# Patient Record
Sex: Male | Born: 1990 | Race: Black or African American | Hispanic: No | Marital: Single | Smoking: Never smoker
Health system: Southern US, Community
[De-identification: ages and names within clinical notes are randomized; demographics above are authoritative.]

---
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
## 2017-01-01 ENCOUNTER — Ambulatory Visit (HOSPITAL_COMMUNITY)
Admission: EM | Admit: 2017-01-01 | Discharge: 2017-01-01 | Disposition: A | Discharge status: Home / Self Care | Payer: Self-pay | Attending: Urgent Care | Admitting: Urgent Care

## 2017-01-01 ENCOUNTER — Ambulatory Visit (INDEPENDENT_AMBULATORY_CARE_PROVIDER_SITE_OTHER): Payer: Self-pay

## 2017-01-01 ENCOUNTER — Encounter (HOSPITAL_COMMUNITY): Payer: Self-pay | Admitting: *Deleted

## 2017-01-01 DIAGNOSIS — L03115 Cellulitis of right lower limb: Secondary | ICD-10-CM

## 2017-01-01 DIAGNOSIS — M79661 Pain in right lower leg: Secondary | ICD-10-CM

## 2017-01-01 MED ORDER — DOXYCYCLINE HYCLATE 100 MG PO CAPS
100.0000 mg | ORAL_CAPSULE | Freq: Two times a day (BID) | ORAL | 0 refills | Status: DC
Start: 2017-01-01 — End: 2018-04-11

## 2017-01-01 NOTE — ED Provider Notes (Signed)
  MRN: [id number] DOB: [birth year]  Subjective:   Seth Franklin is a 26 y.o. male presenting for chief complaint of Abscess  Reports ~10 year history of intermittent recurrent infections of his right lower leg. He also gets infections of his lower abdomen and groin area. He usually just washes his infections and applies dressings to have them resolve on their own. This particular episode started 2 days ago over his right lower leg. Has had drainage, redness, mild pain with walking. Denies fever, numbness or tingling. Denies smoking cigarettes. Patient binge drinks over the weekends.  Seth Franklin is not currently taking any medications and has No Known Allergies.  Seth Franklin denies past medical and surgical history.   Objective:   Vitals: BP 134/82 (BP Location: Right Arm)   Pulse 82   Temp 99.5 F (37.5 C) (Oral)   Resp 18   SpO2 100%   Physical Exam  Constitutional: He is oriented to person, place, and time. He appears well-developed and well-nourished.  Cardiovascular: Normal rate.   Pulmonary/Chest: Effort normal.  Musculoskeletal:       Right lower leg: He exhibits tenderness and swelling (with associated erythema). He exhibits no bony tenderness, no edema, no deformity and no laceration.       Legs: Neurological: He is alert and oriented to person, place, and time.  Skin: Skin is warm and dry.  Psychiatric: He has a normal mood and affect.   Dg Tibia/fibula Right  Result Date: 01/01/2017 CLINICAL DATA:  Medial calf abscess. EXAM: RIGHT TIBIA AND FIBULA - 2 VIEW COMPARISON:  None. FINDINGS: No evidence of bony destruction, soft tissue foreign body or soft tissue gas. The knee and ankle appear normal without evidence of arthropathy. No bony lesions identified. IMPRESSION: Negative. Electronically Signed   By: Irish LackGlenn  Yamagata M.D.   On: 01/01/2017 17:23   Assessment and Plan :   Cellulitis of right lower extremity  Pain in right lower leg  Will start doxycycline. Patient is  to f/u for wound check in 2 days. Recommended patient set up PCP for work up of his recurrent skin infections. Return-to-clinic precautions discussed, patient verbalized understanding.   Wallis BambergMario Delbra Zellars, PA-C [facility] [phone] 01/01/2017  4:37 PM   Wallis BambergMani, Nikcole Eischeid, PA-C 01/01/17 1735

## 2017-01-01 NOTE — ED Triage Notes (Signed)
Pt  Has    A  Yellow  Lesion  To  r   Lower     Leg    With  Redness       And   Swelling  /       Pain    Symptoms   For   sev  Days        Pt  Has   Had    Similar   Lesions  Like  This  Before  Pt also  Reports  As   Well that  He  Has  A  r   Earache

## 2017-11-01 ENCOUNTER — Ambulatory Visit (HOSPITAL_COMMUNITY)
Admission: EM | Admit: 2017-11-01 | Discharge: 2017-11-01 | Disposition: A | Discharge status: Home / Self Care | Payer: Self-pay | Attending: Family Medicine | Admitting: Family Medicine

## 2017-11-01 ENCOUNTER — Encounter (HOSPITAL_COMMUNITY): Payer: Self-pay | Admitting: Emergency Medicine

## 2017-11-01 DIAGNOSIS — H1033 Unspecified acute conjunctivitis, bilateral: Secondary | ICD-10-CM

## 2017-11-01 NOTE — ED Triage Notes (Signed)
Pt c/o bilateral eye redness and drainage.

## 2017-11-01 NOTE — Discharge Instructions (Addendum)
You may try over the counter Naphcon eye drops.

## 2017-11-01 NOTE — ED Provider Notes (Signed)
  [facility]   [id number] 11/01/17 Arrival Time: 1315  ASSESSMENT & PLAN:  1. Acute conjunctivitis of both eyes, unspecified acute conjunctivitis type    Discussed the diagnosis and proper care of conjunctivitis.  Stressed household Presenter, broadcastinghygiene. School/daycare note written. Local eye care discussed.  Reviewed expectations re: course of current medical issues. Questions answered. Outlined signs and symptoms indicating need for more acute intervention. Patient verbalized understanding. After Visit Summary given.   SUBJECTIVE:  Seth Franklin is a 27 y.o. male who presents with complaint of persistent irritation of his eyes.  Onset gradual, approximately 1 day ago. Injury: no. Visual changes: no. Contact lens use: no. Self treatment: none. Describes 'gritty' feeling of eyes. No recent illness. Works at a Corporate treasurerbarber shop.  ROS: As per HPI.  OBJECTIVE:  Vitals:   11/01/17 1402  BP: (!) 153/88  Pulse: (!) 106  Resp: 18  Temp: 98.6 F (37 C)  SpO2: 100%    General appearance: alert; no distress Eyes: conjunctiva: 2+ injection bilaterally Neck: supple Skin: warm and dry Psychological: alert and cooperative; normal mood and affect  No Known Allergies  History reviewed. No pertinent past medical history. Social History   Socioeconomic History  . Marital status: Single    Spouse name: Not on file  . Number of children: Not on file  . Years of education: Not on file  . Highest education level: Not on file  Occupational History  . Not on file  Social Needs  . Financial resource strain: Not on file  . Food insecurity:    Worry: Not on file    Inability: Not on file  . Transportation needs:    Medical: Not on file    Non-medical: Not on file  Tobacco Use  . Smoking status: Never Smoker  . Smokeless tobacco: Former Engineer, waterUser  Substance and Sexual Activity  . Alcohol use: Yes  . Drug use: Not on file  . Sexual activity: Not on file  Lifestyle  . Physical  activity:    Days per week: Not on file    Minutes per session: Not on file  . Stress: Not on file  Relationships  . Social connections:    Talks on phone: Not on file    Gets together: Not on file    Attends religious service: Not on file    Active member of club or organization: Not on file    Attends meetings of clubs or organizations: Not on file    Relationship status: Not on file  . Intimate partner violence:    Fear of current or ex partner: Not on file    Emotionally abused: Not on file    Physically abused: Not on file    Forced sexual activity: Not on file  Other Topics Concern  . Not on file  Social History Narrative  . Not on file   No family history on file. History reviewed. No pertinent surgical history.   Mardella LaymanHagler, Dayjah Selman, MD 11/01/17 1435

## 2018-04-11 ENCOUNTER — Other Ambulatory Visit: Payer: Self-pay

## 2018-04-11 ENCOUNTER — Encounter (INDEPENDENT_AMBULATORY_CARE_PROVIDER_SITE_OTHER): Payer: Self-pay | Admitting: Physician Assistant

## 2018-04-11 ENCOUNTER — Ambulatory Visit (INDEPENDENT_AMBULATORY_CARE_PROVIDER_SITE_OTHER): Payer: Self-pay | Admitting: Physician Assistant

## 2018-04-11 VITALS — BP 154/92 | HR 94 | Temp 98.0°F | Ht 68.0 in | Wt 198.8 lb

## 2018-04-11 DIAGNOSIS — Z0000 Encounter for general adult medical examination without abnormal findings: Secondary | ICD-10-CM

## 2018-04-11 DIAGNOSIS — A630 Anogenital (venereal) warts: Secondary | ICD-10-CM

## 2018-04-11 DIAGNOSIS — Z23 Encounter for immunization: Secondary | ICD-10-CM

## 2018-04-11 NOTE — Progress Notes (Signed)
   Subjective:  Patient ID: Seth NordmannNicholas A Franklin, male    DOB: [birth year]  Age: 27 y.o. MRN: [id number]  CC:   HPI Seth Franklin is a 27 y.o. male with no significant medical history presents as a new patient for an annual physical. Generally feeling well except for a few genital lesions. Thinks he has warts. BP noted to be elevated today. Says he may become anxious when going to the doctor's office. Otherwise, does not feel anxious. Denies any other symptoms or complaints.        Outpatient Medications Prior to Visit  Medication Sig Dispense Refill  . doxycycline (VIBRAMYCIN) 100 MG capsule Take 1 capsule (100 mg total) by mouth 2 (two) times daily. (Patient not taking: Reported on 11/01/2017) 20 capsule 0   No facility-administered medications prior to visit.      ROS Review of Systems  Constitutional: Negative for chills, fever and malaise/fatigue.  Eyes: Negative for blurred vision.  Respiratory: Negative for shortness of breath.   Cardiovascular: Negative for chest pain and palpitations.  Gastrointestinal: Negative for abdominal pain and nausea.  Genitourinary: Negative for dysuria and hematuria.  Musculoskeletal: Negative for joint pain and myalgias.  Skin: Negative for rash.       Genital warts  Neurological: Negative for tingling and headaches.  Psychiatric/Behavioral: Negative for depression. The patient is not nervous/anxious.     Objective:  Ht 5\' 8"  (1.727 m)   Wt 198 lb 12.8 oz (90.2 kg)   BMI 30.23 kg/m   Vitals:   04/11/18 1341  BP: (!) 154/92  Pulse: 94  Temp: 98 F (36.7 C)  TempSrc: Oral  SpO2: 99%  Weight: 198 lb 12.8 oz (90.2 kg)  Height: 5\' 8"  (1.727 m)      Physical Exam Vitals signs reviewed.  Constitutional:      Comments: Well developed, well nourished, NAD, polite  HENT:     Head: Normocephalic and atraumatic.  Eyes:     General: No scleral icterus. Neck:     Musculoskeletal: Normal range of motion and neck supple.   Thyroid: No thyromegaly.  Cardiovascular:     Rate and Rhythm: Normal rate and regular rhythm.     Heart sounds: Normal heart sounds.  Pulmonary:     Effort: Pulmonary effort is normal.     Breath sounds: Normal breath sounds.  Abdominal:     General: Bowel sounds are normal.     Palpations: Abdomen is soft.     Tenderness: There is no abdominal tenderness.  Genitourinary:    Comments: Small papules with cauliflower texture on penis Skin:    General: Skin is warm and dry.     Coloration: Skin is not pale.     Findings: No erythema or rash.  Neurological:     Mental Status: He is alert and oriented to person, place, and time.  Psychiatric:        Behavior: Behavior normal.        Thought Content: Thought content normal.      Assessment & Plan:    1. Annual physical exam - CBC with Differential - Comprehensive metabolic panel - TSH  2. Genital warts - Ambulatory referral to Dermatology     Follow-up: PRN  Loletta Specteroger David  PA

## 2018-04-11 NOTE — Patient Instructions (Signed)
Genital Warts  Genital warts are small growths in the area around the genitals or the anus. They are caused by a type of germ (HPV virus). This germ is spread from person to person during sex. It can be spread through vaginal, anal, and oral sex. Genital warts can lead to other problems if they are not treated.  A person is more likely to have this condition if he or she:   Has sex without using a condom.   Has sex with many people.   Has sex before the age of 16.   Has a weak body defense (immune) system.  This condition can be treated with medicines. Your doctor may also burn or freeze the warts. In some cases, surgery may be done to remove the warts.  Follow these instructions at home:  Medicines     Apply over-the-counter and prescription medicines only as told by your doctor.   Do not use medicines that are meant for treating hand warts.   Talk with your doctor about using creams to treat itching.  Instructions for women   Plan to have regular tests to check for cervical cancer. Your risk for this cancer increases when you have genital warts.   If you become pregnant, tell your doctor that you have had genital warts. The germ can be passed to the baby.  General instructions   Do not touch or scratch the warts.   Do not have sex until your treatment is done.   Tell your current and past sexual partners about your condition. They may need treatment.   After treatment, use condoms during sex.   Keep all follow-up visits as told by your doctor. This is important.  How is this prevented?  Talk with your doctor about getting the HPV shot. The HPV shot:   Can help stop some HPV infections and cancers.   Is given to males and females who are 11-26 years old.   Will not work if you already have HPV.   Is not recommended for pregnant women.  Contact a doctor if:   You have redness, swelling, or pain in the area of the treated skin.   You have a fever.   You feel sick.   You feel lumps in the area  around your genitals or anus.   You have bleeding in the area around your genitals or anus.   You have pain during sex.  Summary   Genital warts are small growths in the areas around the genitals or the anus. They are caused by a type of germ (HPV virus).   The germ is spread by having vaginal, anal, or oral sex without using a condom.   This condition is treated using medicines. In some cases, freezing, burning, or surgery may be done to get rid of the warts.   This condition may be prevented by getting a HPV shot.  This information is not intended to replace advice given to you by your health care provider. Make sure you discuss any questions you have with your health care provider.  Document Released: 07/05/2009 Document Revised: 05/15/2017 Document Reviewed: 05/15/2017  Elsevier Interactive Patient Education  2019 Elsevier Inc.

## 2018-04-12 LAB — COMPREHENSIVE METABOLIC PANEL
ALT: 27 IU/L (ref 0–44)
AST: 22 IU/L (ref 0–40)
Albumin/Globulin Ratio: 2 (ref 1.2–2.2)
Albumin: 5.2 g/dL (ref 3.5–5.5)
Alkaline Phosphatase: 72 IU/L (ref 39–117)
BUN/Creatinine Ratio: 9 (ref 9–20)
BUN: 8 mg/dL (ref 6–20)
Bilirubin Total: 0.6 mg/dL (ref 0.0–1.2)
CO2: 24 mmol/L (ref 20–29)
Calcium: 9.8 mg/dL (ref 8.7–10.2)
Chloride: 100 mmol/L (ref 96–106)
Creatinine, Ser: 0.92 mg/dL (ref 0.76–1.27)
GFR calc Af Amer: 133 mL/min/{1.73_m2} (ref >59)
GFR calc non Af Amer: 115 mL/min/{1.73_m2} (ref >59)
Globulin, Total: 2.6 g/dL (ref 1.5–4.5)
Glucose: 106 mg/dL — ABNORMAL HIGH (ref 65–99)
Potassium: 4 mmol/L (ref 3.5–5.2)
SODIUM: 140 mmol/L (ref 134–144)
Total Protein: 7.8 g/dL (ref 6.0–8.5)

## 2018-04-12 LAB — CBC WITH DIFFERENTIAL/PLATELET
Basophils Absolute: 0.1 10*3/uL (ref 0.0–0.2)
Basos: 1 %
EOS (ABSOLUTE): 0.1 10*3/uL (ref 0.0–0.4)
Eos: 2 %
Hematocrit: 43.6 % (ref 37.5–51.0)
Hemoglobin: 14.8 g/dL (ref 13.0–17.7)
Immature Grans (Abs): 0 10*3/uL (ref 0.0–0.1)
Immature Granulocytes: 1 %
Lymphocytes Absolute: 2.9 10*3/uL (ref 0.7–3.1)
Lymphs: 44 %
MCH: 31 pg (ref 26.6–33.0)
MCHC: 33.9 g/dL (ref 31.5–35.7)
MCV: 91 fL (ref 79–97)
MONOS ABS: 0.6 10*3/uL (ref 0.1–0.9)
Monocytes: 10 %
Neutrophils Absolute: 2.6 10*3/uL (ref 1.4–7.0)
Neutrophils: 42 %
Platelets: 315 10*3/uL (ref 150–450)
RBC: 4.78 x10E6/uL (ref 4.14–5.80)
RDW: 11 % — ABNORMAL LOW (ref 12.3–15.4)
WBC: 6.4 10*3/uL (ref 3.4–10.8)

## 2018-04-12 LAB — TSH: TSH: 3.19 u[IU]/mL (ref 0.450–4.500)

## 2018-04-15 ENCOUNTER — Telehealth (INDEPENDENT_AMBULATORY_CARE_PROVIDER_SITE_OTHER): Payer: Self-pay

## 2018-04-15 NOTE — Telephone Encounter (Signed)
-----   Message from Loletta Specteroger David Gomez, PA-C sent at 04/12/2018  1:47 PM EST ----- Slight elevation in glucose, should have A1c done at next visit. Rest of labs normal.

## 2018-04-15 NOTE — Telephone Encounter (Signed)
Patient is aware of slight elevation in glucose. Advised patient to have A1c at next office visit. All other labs normal. Maryjean Mornempestt S Mathews Stuhr, CMA

## 2018-08-04 IMAGING — DX DG TIBIA/FIBULA 2V*R*
4 series · 4 of 4 positions shown · non-contrast
Comparison: None.

CLINICAL DATA: Medial calf abscess.

EXAM:
RIGHT TIBIA AND FIBULA - 2 VIEW

[tibia ap (1 of 2)]
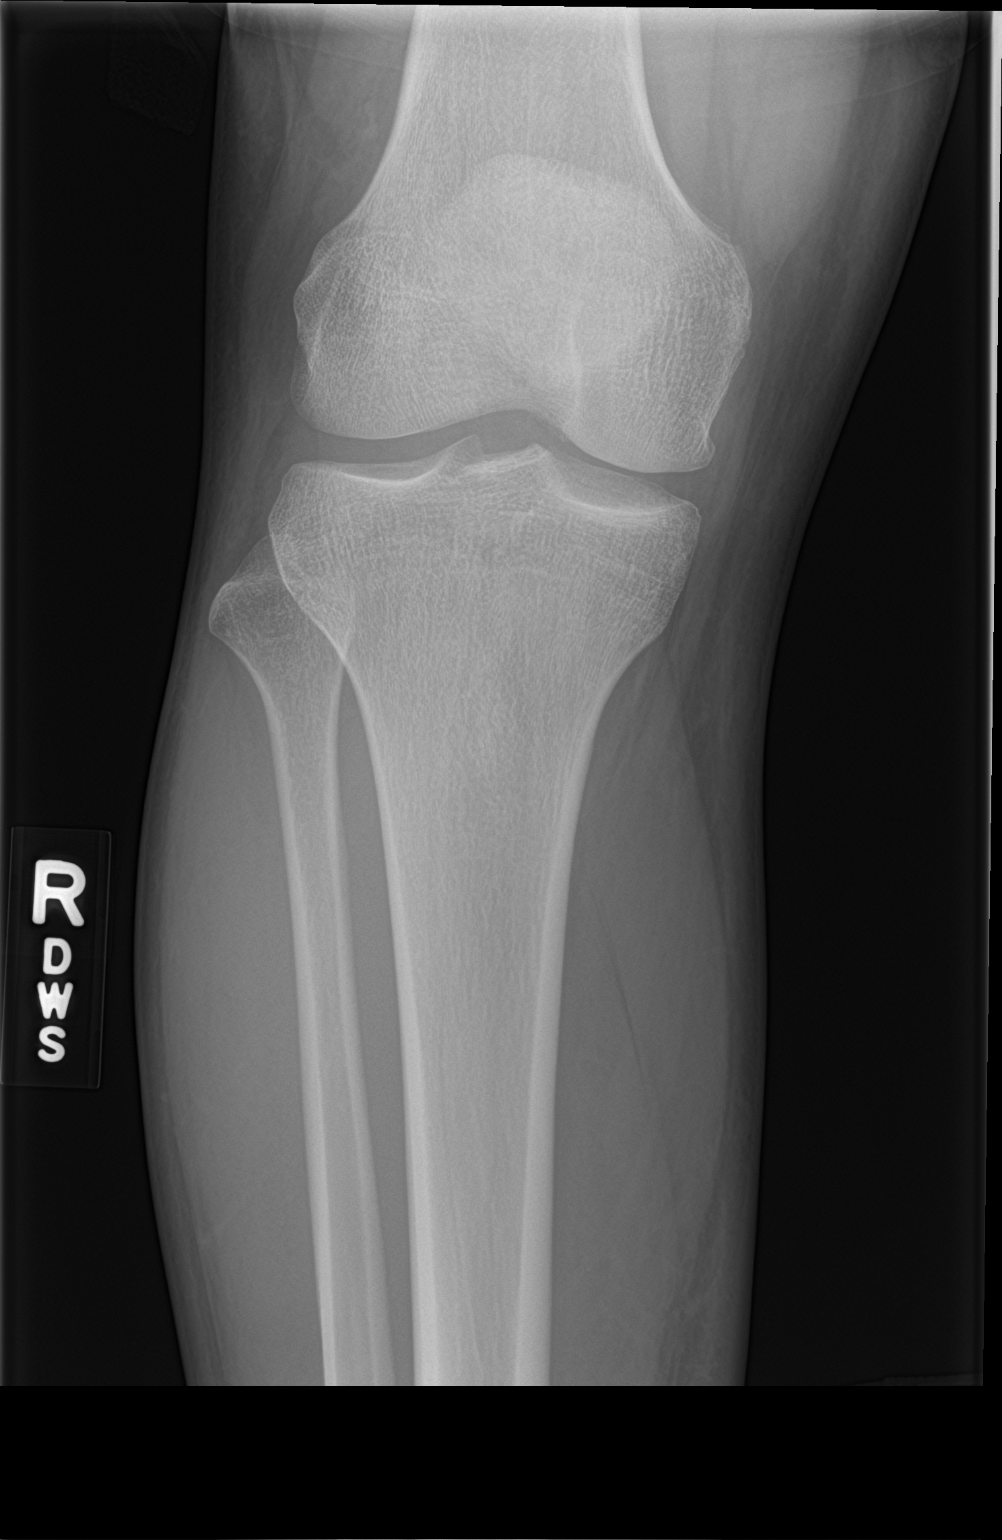

[tibia ap (2 of 2)]
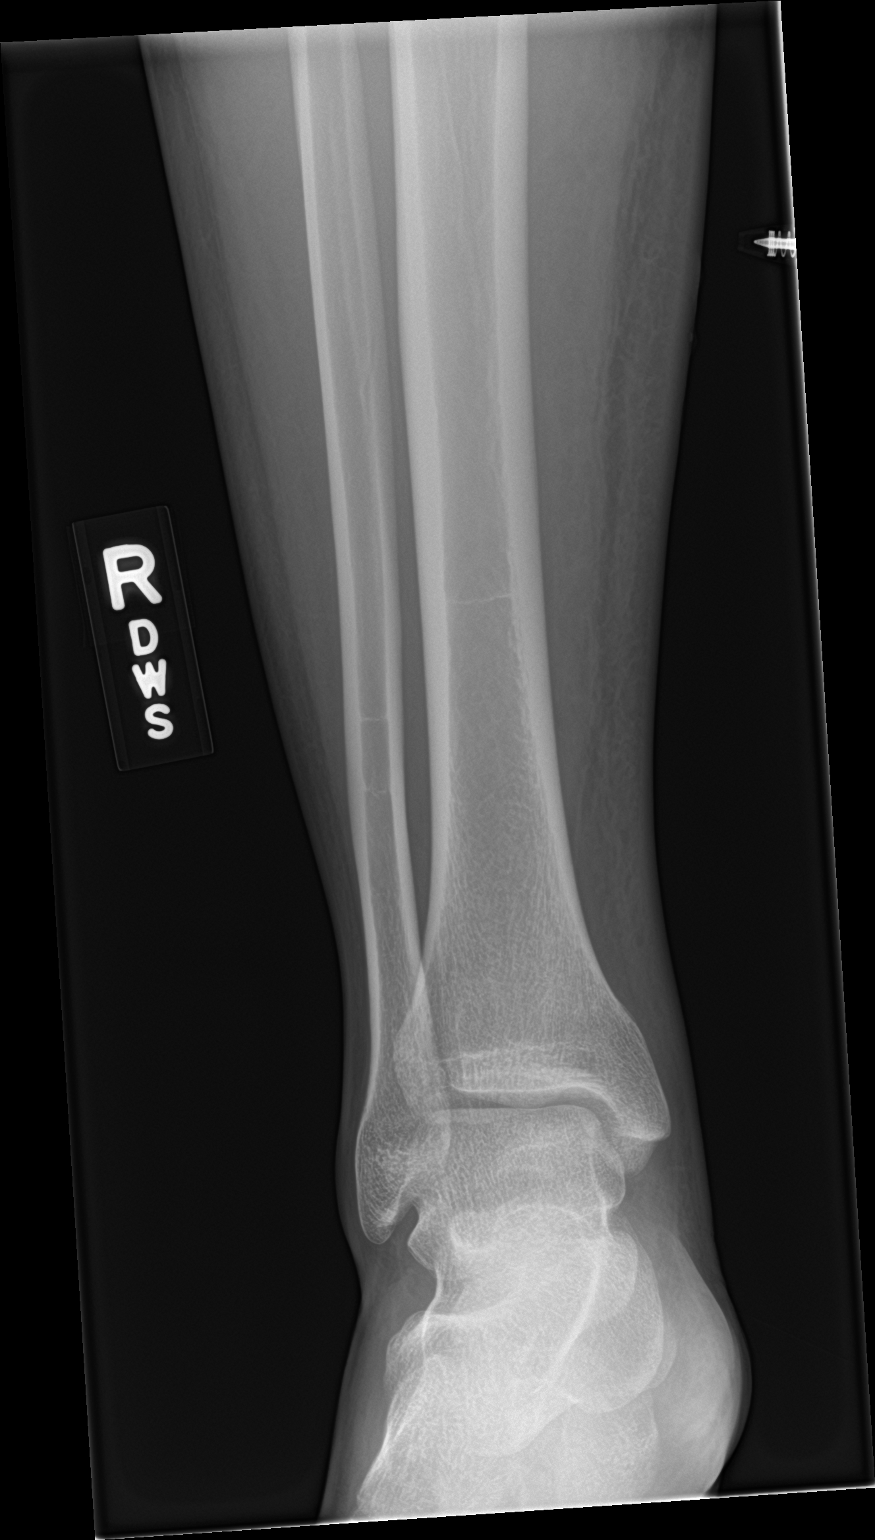

[tibia lat (1 of 2)]
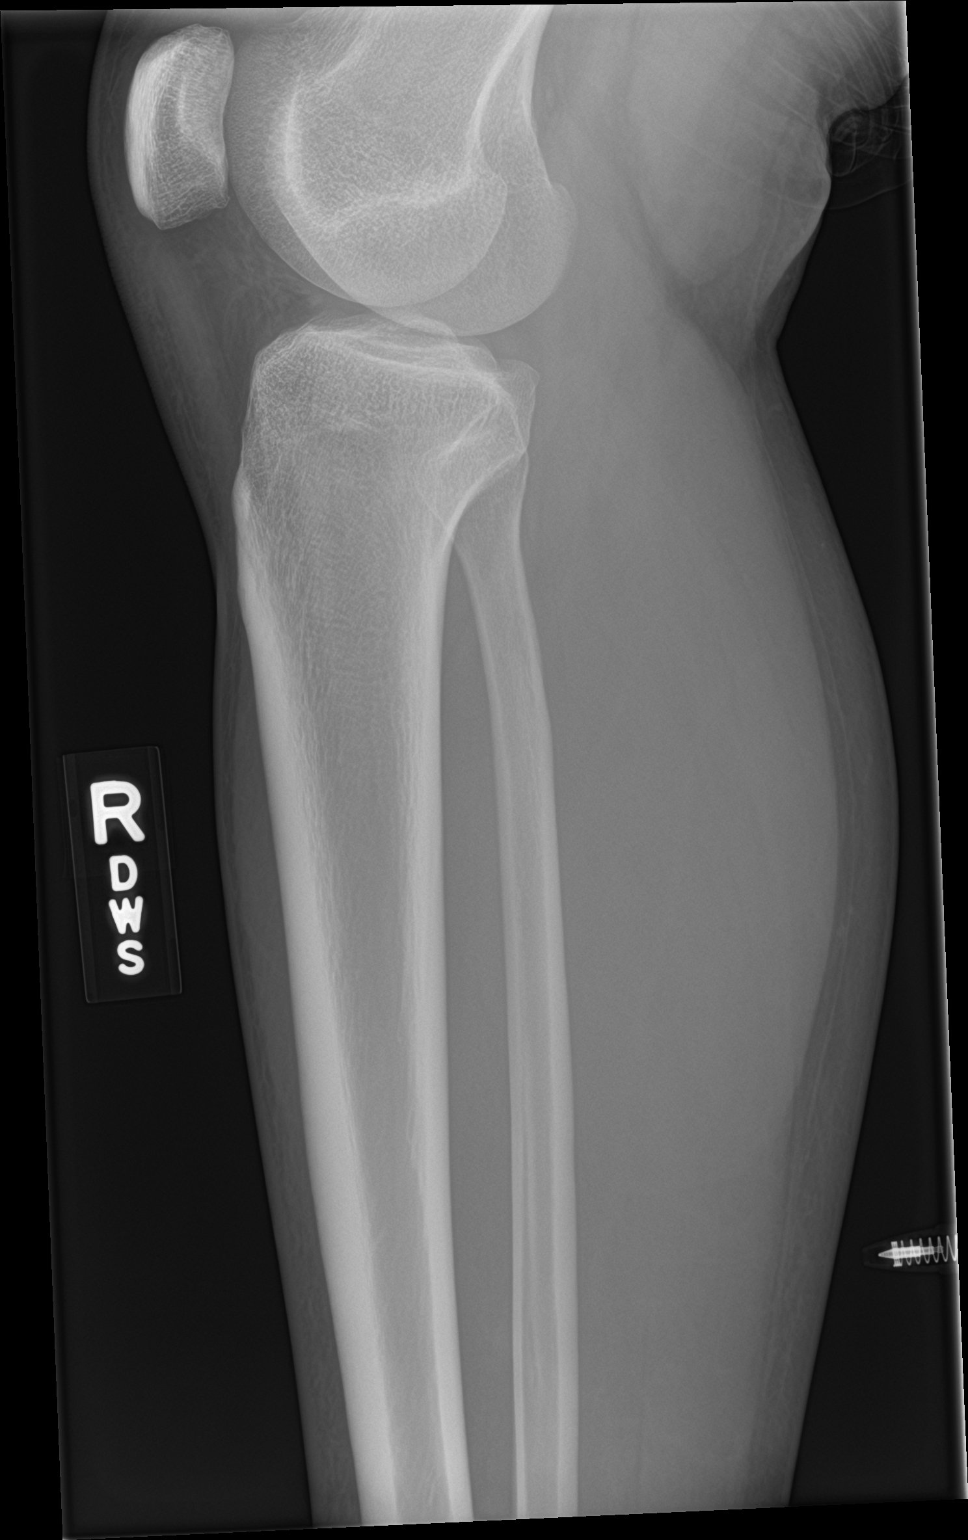

[tibia lat (2 of 2)]
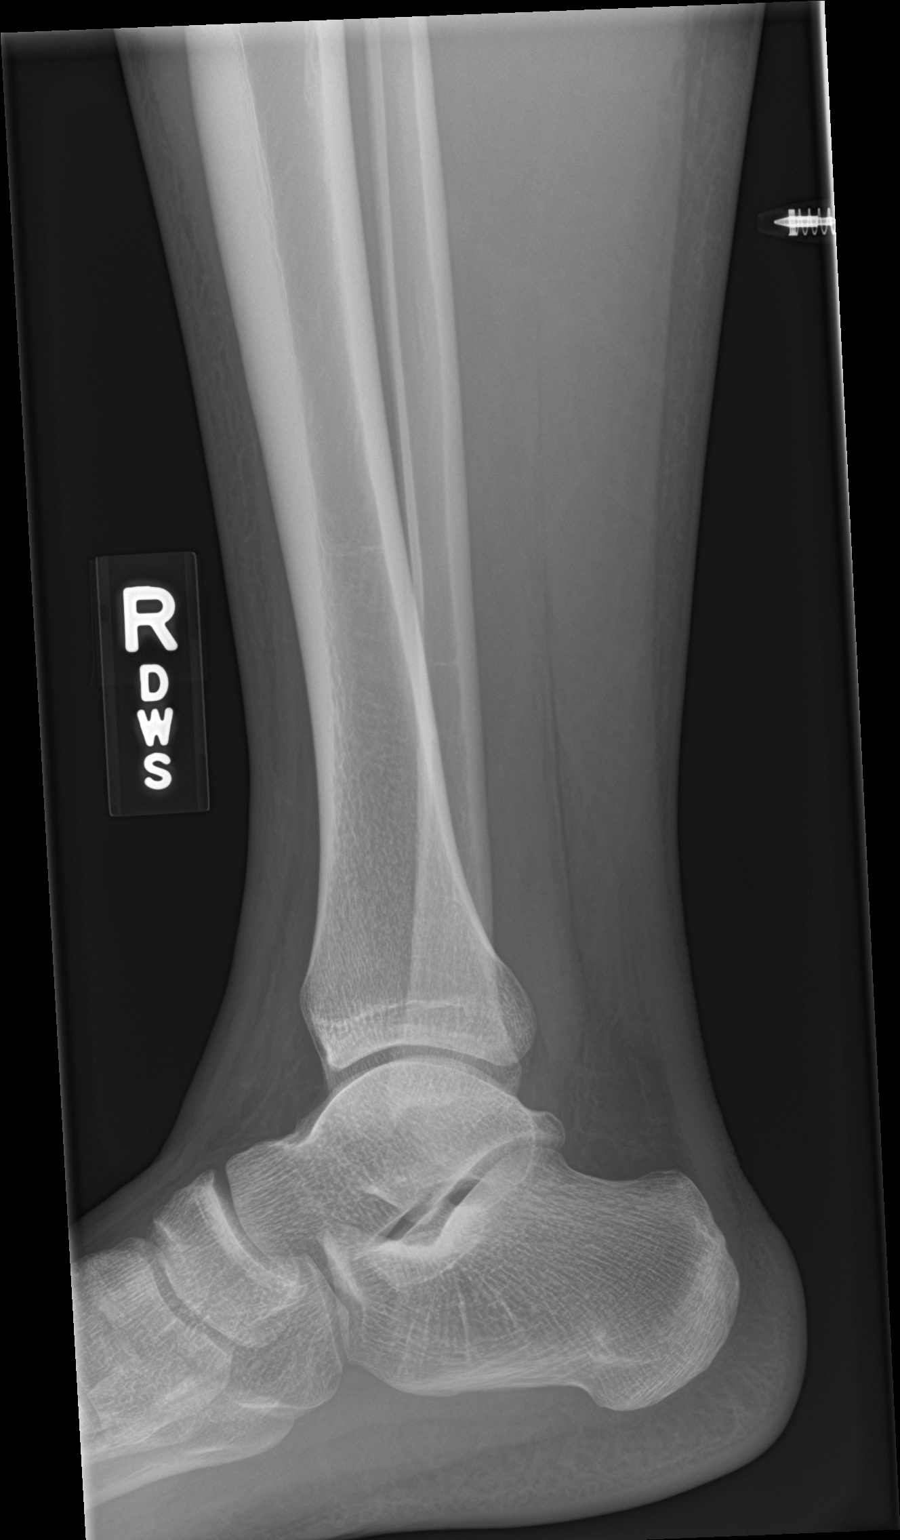

[4 of 4 positions shown; findings below may reference images not displayed]

FINDINGS: No evidence of bony destruction, soft tissue foreign body or soft
tissue gas. The knee and ankle appear normal without evidence of
arthropathy. No bony lesions identified.
IMPRESSION: Negative.

## 2020-03-25 ENCOUNTER — Ambulatory Visit (INDEPENDENT_AMBULATORY_CARE_PROVIDER_SITE_OTHER): Payer: Self-pay | Admitting: Primary Care

## 2020-03-25 ENCOUNTER — Encounter (INDEPENDENT_AMBULATORY_CARE_PROVIDER_SITE_OTHER): Payer: Self-pay | Admitting: Primary Care

## 2020-03-25 ENCOUNTER — Other Ambulatory Visit: Payer: Self-pay

## 2020-03-25 VITALS — BP 153/100 | HR 96 | Temp 97.5°F | Ht 68.0 in | Wt 216.4 lb

## 2020-03-25 DIAGNOSIS — Z202 Contact with and (suspected) exposure to infections with a predominantly sexual mode of transmission: Secondary | ICD-10-CM

## 2020-03-25 DIAGNOSIS — Z7689 Persons encountering health services in other specified circumstances: Secondary | ICD-10-CM

## 2020-03-25 DIAGNOSIS — I10 Essential (primary) hypertension: Secondary | ICD-10-CM

## 2020-03-25 MED ORDER — HYDROCHLOROTHIAZIDE 25 MG PO TABS: 25.0000 mg | ORAL_TABLET | Freq: Every day | ORAL | 3 refills | Status: AC

## 2020-03-25 NOTE — Progress Notes (Signed)
New Patient Office Visit  Subjective:  Patient ID: Seth Franklin, male    DOB: September 27, 1990  Age: 29 y.o. MRN: [id number]  CC:  Chief Complaint  Patient presents with  . New Patient (Initial Visit)    STD testing     HPI Seth Franklin presents for establish of care . Elevated Bp hx reviewed previously document. Denies shortness of breath, headaches, chest pain or lower extremity edema. Also, requesting STD testing previously relationship told him she had a STD but did not let him know what. He is also concern with insomnia.   History reviewed. No pertinent past medical history.  History reviewed. No pertinent surgical history.  History reviewed. No pertinent family history.  Social History   Socioeconomic History  . Marital status: Single    Spouse name: Not on file  . Number of children: Not on file  . Years of education: Not on file  . Highest education level: Not on file  Occupational History  . Not on file  Tobacco Use  . Smoking status: Never Smoker  . Smokeless tobacco: Former Engineer, water and Sexual Activity  . Alcohol use: Yes  . Drug use: Never  . Sexual activity: Yes    Partners: Female    Birth control/protection: Condom  Other Topics Concern  . Not on file  Social History Narrative  . Not on file   Social Determinants of Health   Financial Resource Strain:   . Difficulty of Paying Living Expenses: Not on file  Food Insecurity:   . Worried About Programme researcher, broadcasting/film/video in the Last Year: Not on file  . Ran Out of Food in the Last Year: Not on file  Transportation Needs:   . Lack of Transportation (Medical): Not on file  . Lack of Transportation (Non-Medical): Not on file  Physical Activity:   . Days of Exercise per Week: Not on file  . Minutes of Exercise per Session: Not on file  Stress:   . Feeling of Stress : Not on file  Social Connections:   . Frequency of Communication with Friends and Family: Not on file  . Frequency of  Social Gatherings with Friends and Family: Not on file  . Attends Religious Services: Not on file  . Active Member of Clubs or Organizations: Not on file  . Attends Banker Meetings: Not on file  . Marital Status: Not on file  Intimate Partner Violence:   . Fear of Current or Ex-Partner: Not on file  . Emotionally Abused: Not on file  . Physically Abused: Not on file  . Sexually Abused: Not on file    ROS Review of Systems  Constitutional: Positive for fatigue.  All other systems reviewed and are negative.   Objective:   Today's Vitals: BP (!) 153/100 (BP Location: Right Arm, Patient Position: Sitting, Cuff Size: Large)   Pulse 96   Temp (!) 97.5 F (36.4 C) (Temporal)   Ht 5\' 8"  (1.727 m)   Wt 216 lb 6.4 oz (98.2 kg)   SpO2 98%   BMI 32.90 kg/m   Physical Exam  General: Vital signs reviewed.  Patient is well-developed and well-nourished, male in no acute distress and cooperative with exam.  Head: Normocephalic and atraumatic. Eyes: EOMI, conjunctivae normal, no scleral icterus.  Neck: Supple, trachea midline, normal ROM, no JVD, masses, thyromegaly, or carotid bruit present.  Cardiovascular: RRR, S1 normal, S2 normal, no murmurs, gallops, or rubs. Pulmonary/Chest: Clear to auscultation  bilaterally, no wheezes, rales, or rhonchi. Abdominal: Soft, non-tender, non-distended, BS +, no masses, organomegaly, or guarding present.  Musculoskeletal: No joint deformities, erythema, or stiffness, ROM full and nontender. Extremities: No lower extremity edema bilaterally,  pulses symmetric and intact bilaterally. No cyanosis or clubbing. Neurological: A&O x3, Strength is normal and symmetric bilaterally, cranial nerve II-XII are grossly intact, no focal motor deficit, sensory intact to light touch bilaterally.  Skin: Warm, dry and intact. No rashes or erythema. Psychiatric: Normal mood and affect. speech and behavior is normal. Cognition and memory are normal.  Assessment  & Plan:  Seth Franklin was seen today for new patient (initial visit).  Diagnoses and all orders for this visit:  Encounter to establish care Establish care with new PCP   Essential hypertension Counseled on blood pressure goal of less than 130/80, low-sodium, DASH diet, medication compliance, 150 minutes of moderate intensity exercise per week. Discussed medication compliance, adverse effects. -     hydrochlorothiazide (HYDRODIURIL) 25 MG tablet; Take 1 tablet (25 mg total) by mouth daily.  Possible exposure to STD -     Cancel: Urine Culture -     Cytology (oral, anal, urethral) ancillary only -     HIV antibody (with reflex)    Outpatient Encounter Medications as of 03/25/2020  Medication Sig  . hydrochlorothiazide (HYDRODIURIL) 25 MG tablet Take 1 tablet (25 mg total) by mouth daily.   No facility-administered encounter medications on file as of 03/25/2020.    Follow-up: Return in about 6 weeks (around 05/06/2020) for BP ck.   Grayce Sessions, NP

## 2020-03-25 NOTE — Patient Instructions (Signed)
Preventing Hypertension Hypertension, commonly called high blood pressure, is when the force of blood pumping through the arteries is too strong. Arteries are blood vessels that carry blood from the heart throughout the body. Over time, hypertension can damage the arteries and decrease blood flow to important parts of the body, including the brain, heart, and kidneys. Often, hypertension does not cause symptoms until blood pressure is very high. For this reason, it is important to have your blood pressure checked on a regular basis. Hypertension can often be prevented with diet and lifestyle changes. If you already have hypertension, you can control it with diet and lifestyle changes, as well as medicine. What nutrition changes can be made? Maintain a healthy diet. This includes:  Eating less salt (sodium). Ask your health care provider how much sodium is safe for you to have. The general recommendation is to consume less than 1 tsp (2,300 mg) of sodium a day. ? Do not add salt to your food. ? Choose low-sodium options when grocery shopping and eating out.  Limiting fats in your diet. You can do this by eating low-fat or fat-free dairy products and by eating less red meat.  Eating more fruits, vegetables, and whole grains. Make a goal to eat: ? 1-2 cups of fresh fruits and vegetables each day. ? 3-4 servings of whole grains each day.  Avoiding foods and beverages that have added sugars.  Eating fish that contain healthy fats (omega-3 fatty acids), such as mackerel or salmon. If you need help putting together a healthy eating plan, try the DASH diet. This diet is high in fruits, vegetables, and whole grains. It is low in sodium, red meat, and added sugars. DASH stands for Dietary Approaches to Stop Hypertension. What lifestyle changes can be made?   Lose weight if you are overweight. Losing just 3?5% of your body weight can help prevent or control hypertension. ? For example, if your present  weight is 200 lb (91 kg), a loss of 3-5% of your weight means losing 6-10 lb (2.7-4.5 kg). ? Ask your health care provider to help you with a diet and exercise plan to safely lose weight.  Get enough exercise. Do at least 150 minutes of moderate-intensity exercise each week. ? You could do this in short exercise sessions several times a day, or you could do longer exercise sessions a few times a week. For example, you could take a brisk 10-minute walk or bike ride, 3 times a day, for 5 days a week.  Find ways to reduce stress, such as exercising, meditating, listening to music, or taking a yoga class. If you need help reducing stress, ask your health care provider.  Do not smoke. This includes e-cigarettes. Chemicals in tobacco and nicotine products raise your blood pressure each time you smoke. If you need help quitting, ask your health care provider.  Avoid alcohol. If you drink alcohol, limit alcohol intake to no more than 1 drink a day for nonpregnant women and 2 drinks a day for men. One drink equals 12 oz of beer, 5 oz of wine, or 1 oz of hard liquor. Why are these changes important? Diet and lifestyle changes can help you prevent hypertension, and they may make you feel better overall and improve your quality of life. If you have hypertension, making these changes will help you control it and help prevent major complications, such as:  Hardening and narrowing of arteries that supply blood to: ? Your heart. This can cause a heart   attack. ? Your brain. This can cause a stroke. ? Your kidneys. This can cause kidney failure.  Stress on your heart muscle, which can cause heart failure. What can I do to lower my risk?  Work with your health care provider to make a hypertension prevention plan that works for you. Follow your plan and keep all follow-up visits as told by your health care provider.  Learn how to check your blood pressure at home. Make sure that you know your personal target  blood pressure, as told by your health care provider. How is this treated? In addition to diet and lifestyle changes, your health care provider may recommend medicines to help lower your blood pressure. You may need to try a few different medicines to find what works best for you. You also may need to take more than one medicine. Take over-the-counter and prescription medicines only as told by your health care provider. Where to find support Your health care provider can help you prevent hypertension and help you keep your blood pressure at a healthy level. Your local hospital or your community may also provide support services and prevention programs. The American Heart Association offers an online support network at: http://supportnetwork.heart.org/high-blood-pressure Where to find more information Learn more about hypertension from:  National Heart, Lung, and Blood Institute: www.nhlbi.nih.gov/health/health-topics/topics/hbp  Centers for Disease Control and Prevention: www.cdc.gov/bloodpressure  American Academy of Family Physicians: http://familydoctor.org/familydoctor/en/diseases-conditions/high-blood-pressure.printerview.all.html Learn more about the DASH diet from:  National Heart, Lung, and Blood Institute: www.nhlbi.nih.gov/health/health-topics/topics/dash Contact a health care provider if:  You think you are having a reaction to medicines you have taken.  You have recurrent headaches or feel dizzy.  You have swelling in your ankles.  You have trouble with your vision. Summary  Hypertension often does not cause any symptoms until blood pressure is very high. It is important to get your blood pressure checked regularly.  Diet and lifestyle changes are the most important steps in preventing hypertension.  By keeping your blood pressure in a healthy range, you can prevent complications like heart attack, heart failure, stroke, and kidney failure.  Work with your health care  provider to make a hypertension prevention plan that works for you. This information is not intended to replace advice given to you by your health care provider. Make sure you discuss any questions you have with your health care provider. Document Revised: 08/02/2018 Document Reviewed: 12/20/2015 Elsevier Patient Education  2020 Elsevier Inc.  

## 2020-03-26 LAB — CYTOLOGY, (ORAL, ANAL, URETHRAL) ANCILLARY ONLY
Chlamydia: NEGATIVE
Comment: NEGATIVE
Comment: NEGATIVE
Comment: NORMAL
Neisseria Gonorrhea: NEGATIVE
Trichomonas: NEGATIVE

## 2020-03-26 LAB — HIV ANTIBODY (ROUTINE TESTING W REFLEX): HIV Screen 4th Generation wRfx: NONREACTIVE
